# Patient Record
Sex: Female | Born: 1945 | Race: White | Hispanic: No | Marital: Married | Smoking: Never smoker
Health system: Southern US, Community
[De-identification: ages and names within clinical notes are randomized; demographics above are authoritative.]

## PROBLEM LIST (undated history)

## (undated) DIAGNOSIS — K219 Gastro-esophageal reflux disease without esophagitis: Secondary | ICD-10-CM

## (undated) DIAGNOSIS — R51 Headache: Secondary | ICD-10-CM

## (undated) DIAGNOSIS — I10 Essential (primary) hypertension: Secondary | ICD-10-CM

## (undated) DIAGNOSIS — R519 Headache, unspecified: Secondary | ICD-10-CM

## (undated) DIAGNOSIS — I00 Rheumatic fever without heart involvement: Secondary | ICD-10-CM

## (undated) HISTORY — PX: CHOLECYSTECTOMY: SHX55

## (undated) HISTORY — PX: APPENDECTOMY: SHX54

## (undated) HISTORY — PX: DILATION AND CURETTAGE OF UTERUS: SHX78

## (undated) HISTORY — PX: COLONOSCOPY: SHX174

## (undated) HISTORY — PX: TONSILLECTOMY: SUR1361

---

## 2004-05-30 ENCOUNTER — Ambulatory Visit: Payer: Self-pay | Admitting: Internal Medicine

## 2005-02-28 ENCOUNTER — Ambulatory Visit: Payer: Self-pay | Admitting: Gastroenterology

## 2005-08-21 ENCOUNTER — Ambulatory Visit: Payer: Self-pay | Admitting: Internal Medicine

## 2006-06-01 ENCOUNTER — Ambulatory Visit: Payer: Self-pay | Admitting: Internal Medicine

## 2007-09-14 ENCOUNTER — Ambulatory Visit: Payer: Self-pay | Admitting: Unknown Physician Specialty

## 2008-07-05 ENCOUNTER — Ambulatory Visit: Payer: Self-pay | Admitting: Internal Medicine

## 2010-05-17 ENCOUNTER — Ambulatory Visit: Payer: Self-pay | Admitting: Unknown Physician Specialty

## 2010-06-26 ENCOUNTER — Ambulatory Visit: Payer: Self-pay | Admitting: Internal Medicine

## 2016-06-19 DIAGNOSIS — Z1211 Encounter for screening for malignant neoplasm of colon: Secondary | ICD-10-CM | POA: Diagnosis not present

## 2016-06-19 DIAGNOSIS — Z23 Encounter for immunization: Secondary | ICD-10-CM | POA: Diagnosis not present

## 2016-06-19 DIAGNOSIS — I10 Essential (primary) hypertension: Secondary | ICD-10-CM | POA: Diagnosis not present

## 2016-06-19 DIAGNOSIS — E785 Hyperlipidemia, unspecified: Secondary | ICD-10-CM | POA: Diagnosis not present

## 2016-07-29 DIAGNOSIS — I10 Essential (primary) hypertension: Secondary | ICD-10-CM | POA: Diagnosis not present

## 2016-07-29 DIAGNOSIS — Z800 Family history of malignant neoplasm of digestive organs: Secondary | ICD-10-CM | POA: Diagnosis not present

## 2016-07-29 DIAGNOSIS — Z8371 Family history of colonic polyps: Secondary | ICD-10-CM | POA: Diagnosis not present

## 2016-07-29 DIAGNOSIS — Z1211 Encounter for screening for malignant neoplasm of colon: Secondary | ICD-10-CM | POA: Diagnosis not present

## 2016-10-23 ENCOUNTER — Encounter: Payer: Self-pay | Admitting: *Deleted

## 2016-10-24 ENCOUNTER — Encounter: Payer: Self-pay | Admitting: *Deleted

## 2016-10-24 ENCOUNTER — Ambulatory Visit: Payer: PPO | Admitting: Anesthesiology

## 2016-10-24 ENCOUNTER — Ambulatory Visit
Admission: RE | Admit: 2016-10-24 | Discharge: 2016-10-24 | Disposition: A | Discharge status: Home / Self Care | Payer: PPO | Source: Ambulatory Visit | Attending: Unknown Physician Specialty | Admitting: Unknown Physician Specialty

## 2016-10-24 ENCOUNTER — Encounter
Admission: RE | Disposition: A | Discharge status: Home / Self Care | Payer: Self-pay | Source: Ambulatory Visit | Attending: Unknown Physician Specialty

## 2016-10-24 DIAGNOSIS — I10 Essential (primary) hypertension: Secondary | ICD-10-CM | POA: Insufficient documentation

## 2016-10-24 DIAGNOSIS — K648 Other hemorrhoids: Secondary | ICD-10-CM | POA: Diagnosis not present

## 2016-10-24 DIAGNOSIS — Z1211 Encounter for screening for malignant neoplasm of colon: Secondary | ICD-10-CM | POA: Insufficient documentation

## 2016-10-24 DIAGNOSIS — K219 Gastro-esophageal reflux disease without esophagitis: Secondary | ICD-10-CM | POA: Insufficient documentation

## 2016-10-24 DIAGNOSIS — K5730 Diverticulosis of large intestine without perforation or abscess without bleeding: Secondary | ICD-10-CM | POA: Insufficient documentation

## 2016-10-24 DIAGNOSIS — Z79899 Other long term (current) drug therapy: Secondary | ICD-10-CM | POA: Diagnosis not present

## 2016-10-24 DIAGNOSIS — Z7982 Long term (current) use of aspirin: Secondary | ICD-10-CM | POA: Insufficient documentation

## 2016-10-24 DIAGNOSIS — K5790 Diverticulosis of intestine, part unspecified, without perforation or abscess without bleeding: Secondary | ICD-10-CM | POA: Diagnosis not present

## 2016-10-24 DIAGNOSIS — K640 First degree hemorrhoids: Secondary | ICD-10-CM | POA: Diagnosis not present

## 2016-10-24 DIAGNOSIS — Z800 Family history of malignant neoplasm of digestive organs: Secondary | ICD-10-CM | POA: Diagnosis not present

## 2016-10-24 HISTORY — DX: Headache, unspecified: R51.9

## 2016-10-24 HISTORY — DX: Rheumatic fever without heart involvement: I00

## 2016-10-24 HISTORY — DX: Headache: R51

## 2016-10-24 HISTORY — PX: COLONOSCOPY WITH PROPOFOL: SHX5780

## 2016-10-24 HISTORY — DX: Gastro-esophageal reflux disease without esophagitis: K21.9

## 2016-10-24 HISTORY — DX: Essential (primary) hypertension: I10

## 2016-10-24 SURGERY — COLONOSCOPY WITH PROPOFOL
Anesthesia: General

## 2016-10-24 MED ORDER — PROPOFOL 500 MG/50ML IV EMUL
INTRAVENOUS | Status: AC
  Filled 2016-10-24: qty 50

## 2016-10-24 MED ORDER — PROPOFOL 10 MG/ML IV BOLUS
INTRAVENOUS | Status: DC | PRN
  Administered 2016-10-24: 90 mg via INTRAVENOUS

## 2016-10-24 MED ORDER — FENTANYL CITRATE (PF) 100 MCG/2ML IJ SOLN: 25.0000 ug | INTRAMUSCULAR | Status: DC | PRN

## 2016-10-24 MED ORDER — ONDANSETRON HCL 4 MG/2ML IJ SOLN: 4.0000 mg | Freq: Once | INTRAMUSCULAR | Status: DC | PRN

## 2016-10-24 MED ORDER — SODIUM CHLORIDE 0.9 % IV SOLN: INTRAVENOUS | Status: DC

## 2016-10-24 MED ORDER — PROPOFOL 500 MG/50ML IV EMUL
INTRAVENOUS | Status: DC | PRN
  Administered 2016-10-24: 100 ug/kg/min via INTRAVENOUS

## 2016-10-24 MED ORDER — SODIUM CHLORIDE 0.9 % IV SOLN
INTRAVENOUS | Status: DC
  Administered 2016-10-24: 10:00:00 via INTRAVENOUS

## 2016-10-24 NOTE — H&P (Signed)
   Primary Care Physician:  Jerl MinaHedrick, James, MD Primary Gastroenterologist:  Dr. Mechele CollinElliott  Pre-Procedure History & Physical: HPI:  Daisy PatienceCarol A Placeres is a 71 y.o. female is here for an colonoscopy.   Past Medical History:  Diagnosis Date  . GERD (gastroesophageal reflux disease)   . Headache   . Hypertension   . Rheumatic fever    as a child    Past Surgical History:  Procedure Laterality Date  . APPENDECTOMY    . CHOLECYSTECTOMY    . COLONOSCOPY    . DILATION AND CURETTAGE OF UTERUS    . TONSILLECTOMY      Prior to Admission medications   Medication Sig Start Date End Date Taking? Authorizing Provider  aspirin EC 81 MG tablet Take 81 mg by mouth daily.   Yes [provider]  cholecalciferol (VITAMIN D) 400 units TABS tablet Take 400 Units by mouth.   Yes [provider]  hydrochlorothiazide (HYDRODIURIL) 25 MG tablet Take 25 mg by mouth daily.   Yes [provider]  losartan (COZAAR) 50 MG tablet Take 50 mg by mouth daily.   Yes [provider]  omega-3 acid ethyl esters (LOVAZA) 1 g capsule Take by mouth 2 (two) times daily.   Yes [provider]  calcium carbonate (TUMS - DOSED IN MG ELEMENTAL CALCIUM) 500 MG chewable tablet Chew 1 tablet by mouth daily.    [provider]    Allergies as of 08/12/2016  . (Not on File)    History reviewed. No pertinent family history.  Social History   Social History  . Marital status: Married    Spouse name: N/A  . Number of children: N/A  . Years of education: N/A   Occupational History  . Not on file.   Social History Main Topics  . Smoking status: Never Smoker  . Smokeless tobacco: Never Used  . Alcohol use No  . Drug use: No  . Sexual activity: Not on file   Other Topics Concern  . Not on file   Social History Narrative  . No narrative on file    Review of Systems: See HPI, otherwise negative ROS  Physical Exam: BP (!) 155/92   Pulse 68   Temp 97.1 F  (36.2 C) (Tympanic)   Resp 16   Ht 4\' 10"  (1.473 m)   Wt 53.5 kg (118 lb)   SpO2 100%   BMI 24.66 kg/m  General:   Alert,  pleasant and cooperative in NAD Head:  Normocephalic and atraumatic. Neck:  Supple; no masses or thyromegaly. Lungs:  Clear throughout to auscultation.    Heart:  Regular rate and rhythm. Abdomen:  Soft, nontender and nondistended. Normal bowel sounds, without guarding, and without rebound.   Neurologic:  Alert and  oriented x4;  grossly normal neurologically.  Impression/Plan: Daisy Patiencearol A Mogel is here for an colonoscopy to be performed for family history of colon cancer in her mother  Risks, benefits, limitations, and alternatives regarding  colonoscopy have been reviewed with the patient.  Questions have been answered.  All parties agreeable.   Lynnae PrudeELLIOTT, ROBERT, MD  10/24/2016, 11:00 AM

## 2016-10-24 NOTE — Anesthesia Post-op Follow-up Note (Cosign Needed)
Anesthesia QCDR form completed.        

## 2016-10-24 NOTE — Op Note (Signed)
[facility] Patient Name: Daisy KernCarol Aguilar Procedure Date: 10/24/2016 11:02 AM MRN: [id number] Account #: [id number] Date of Birth: 05/29/1946 Admit Type: Outpatient Age: [age] [facility] Gender: Female Note Status: Finalized Procedure:            Colonoscopy Indications:          Screening in patient at increased risk: Family history                        of 1st-degree relative with colorectal cancer Providers:            Scot Junobert T. Feather Berrie, MD Referring MD:         Rhona LeavensJames F. Burnett ShengHedrick, MD (Referring MD) Medicines:            Propofol per Anesthesia Complications:        No immediate complications. Procedure:            Pre-Anesthesia Assessment:                       - After reviewing the risks and benefits, the patient                        was deemed in satisfactory condition to undergo the                        procedure.                       After obtaining informed consent, the colonoscope was                        passed under direct vision. Throughout the procedure,                        the patient's blood pressure, pulse, and oxygen                        saturations were monitored continuously. The Olympus                        PCF-H180AL colonoscope ( S#: [id number] ) was introduced                        through the anus and advanced to the the cecum,                        identified by appendiceal orifice and ileocecal valve.                        The colonoscopy was performed without difficulty. The                        patient tolerated the procedure well. The quality of                        the bowel preparation was good. Findings:      Multiple small-mouthed diverticula were found in the sigmoid colon,       descending colon, transverse colon and ascending colon.      Internal hemorrhoids were found during endoscopy. The hemorrhoids were  Grade I (internal hemorrhoids that do not prolapse).      The exam was  otherwise without abnormality. Impression:           - Diverticulosis in the sigmoid colon, in the                        descending colon, in the transverse colon and in the                        ascending colon.                       - Internal hemorrhoids.                       - The examination was otherwise normal.                       - No specimens collected. Recommendation:       - Repeat colonoscopy in 5 years for screening purposes. Scot Jun, MD 10/24/2016 11:29:06 AM This report has been signed electronically. Number of Addenda: 0 Note Initiated On: 10/24/2016 11:02 AM Scope Withdrawal Time: 0 hours 8 minutes 50 seconds  Total Procedure Duration: 0 hours 17 minutes 12 seconds       [facility]

## 2016-10-24 NOTE — Transfer of Care (Signed)
Immediate Anesthesia Transfer of Care Note  Patient: Daisy Aguilar  Procedure(s) Performed: Procedure(s): COLONOSCOPY WITH PROPOFOL (N/A)  Patient Location: PACU and Endoscopy Unit  Anesthesia Type:General  Level of Consciousness: drowsy and patient cooperative  Airway & Oxygen Therapy: Patient Spontanous Breathing and Patient connected to nasal cannula oxygen  Post-op Assessment: Report given to RN and Post -op Vital signs reviewed and stable  Post vital signs: Reviewed and stable  Last Vitals:  Vitals:   10/24/16 1005 10/24/16 1132  BP: (!) 155/92 (!) 102/56  Pulse: 68 70  Resp: 16 16  Temp: 36.2 C (!) 36.1 C    Last Pain:  Vitals:   10/24/16 1132  TempSrc: Tympanic         Complications: No apparent anesthesia complications

## 2016-10-24 NOTE — Anesthesia Postprocedure Evaluation (Signed)
Anesthesia Post Note  Patient: Daisy Aguilar  Procedure(s) Performed: Procedure(s) (LRB): COLONOSCOPY WITH PROPOFOL (N/A)  Patient location during evaluation: PACU Anesthesia Type: General Level of consciousness: awake and alert and oriented Pain management: pain level controlled Vital Signs Assessment: post-procedure vital signs reviewed and stable Respiratory status: spontaneous breathing Cardiovascular status: blood pressure returned to baseline Anesthetic complications: no     Last Vitals:  Vitals:   10/24/16 1152 10/24/16 1201  BP: (!) 175/67 (!) 150/76  Pulse: (!) 55 (!) 50  Resp: 13 (!) 9  Temp:      Last Pain:  Vitals:   10/24/16 1132  TempSrc: Tympanic                 Sueann Brownley

## 2016-10-24 NOTE — Anesthesia Preprocedure Evaluation (Signed)
Anesthesia Evaluation  Patient identified by MRN, date of birth, ID band Patient awake    Reviewed: Allergy & Precautions, NPO status , Patient's Chart, lab work & pertinent test results  Airway Mallampati: III  TM Distance: <3 FB     Dental   Pulmonary neg pulmonary ROS,    Pulmonary exam normal        Cardiovascular hypertension, Pt. on medications Normal cardiovascular exam     Neuro/Psych  Headaches, negative psych ROS   GI/Hepatic Neg liver ROS, GERD  ,  Endo/Other  negative endocrine ROS  Renal/GU negative Renal ROS  negative genitourinary   Musculoskeletal negative musculoskeletal ROS (+)   Abdominal Normal abdominal exam  (+)   Peds negative pediatric ROS (+)  Hematology negative hematology ROS (+)   Anesthesia Other Findings   Reproductive/Obstetrics                            Anesthesia Physical Anesthesia Plan  ASA: II  Anesthesia Plan: General   Post-op Pain Management:    Induction: Intravenous  Airway Management Planned: Nasal Cannula  Additional Equipment:   Intra-op Plan:   Post-operative Plan:   Informed Consent: I have reviewed the patients History and Physical, chart, labs and discussed the procedure including the risks, benefits and alternatives for the proposed anesthesia with the patient or authorized representative who has indicated his/her understanding and acceptance.   Dental advisory given  Plan Discussed with: CRNA and Surgeon  Anesthesia Plan Comments:         Anesthesia Quick Evaluation

## 2016-10-30 ENCOUNTER — Encounter: Payer: Self-pay | Admitting: Unknown Physician Specialty

## 2016-12-15 DIAGNOSIS — K5792 Diverticulitis of intestine, part unspecified, without perforation or abscess without bleeding: Secondary | ICD-10-CM | POA: Diagnosis not present

## 2016-12-15 DIAGNOSIS — R110 Nausea: Secondary | ICD-10-CM | POA: Diagnosis not present

## 2017-03-13 DIAGNOSIS — Z23 Encounter for immunization: Secondary | ICD-10-CM | POA: Diagnosis not present

## 2017-05-07 DIAGNOSIS — Z0000 Encounter for general adult medical examination without abnormal findings: Secondary | ICD-10-CM | POA: Diagnosis not present

## 2017-05-14 DIAGNOSIS — Z0000 Encounter for general adult medical examination without abnormal findings: Secondary | ICD-10-CM | POA: Diagnosis not present

## 2017-05-14 DIAGNOSIS — I10 Essential (primary) hypertension: Secondary | ICD-10-CM | POA: Diagnosis not present

## 2017-05-14 DIAGNOSIS — E785 Hyperlipidemia, unspecified: Secondary | ICD-10-CM | POA: Diagnosis not present

## 2017-05-14 DIAGNOSIS — Z23 Encounter for immunization: Secondary | ICD-10-CM | POA: Diagnosis not present

## 2017-05-14 DIAGNOSIS — H6983 Other specified disorders of Eustachian tube, bilateral: Secondary | ICD-10-CM | POA: Diagnosis not present

## 2017-05-15 ENCOUNTER — Other Ambulatory Visit: Payer: Self-pay | Admitting: Family Medicine

## 2017-05-15 DIAGNOSIS — Z1231 Encounter for screening mammogram for malignant neoplasm of breast: Secondary | ICD-10-CM

## 2017-06-24 ENCOUNTER — Ambulatory Visit
Admission: RE | Admit: 2017-06-24 | Discharge: 2017-06-24 | Disposition: A | Discharge status: Home / Self Care | Payer: PPO | Source: Ambulatory Visit | Attending: Family Medicine | Admitting: Family Medicine

## 2017-06-24 DIAGNOSIS — Z1231 Encounter for screening mammogram for malignant neoplasm of breast: Secondary | ICD-10-CM | POA: Insufficient documentation

## 2017-07-08 ENCOUNTER — Inpatient Hospital Stay
Admission: RE | Admit: 2017-07-08 | Discharge: 2017-07-08 | Disposition: A | Discharge status: Home / Self Care | Payer: Self-pay | Source: Ambulatory Visit | Attending: *Deleted | Admitting: *Deleted

## 2017-07-08 ENCOUNTER — Other Ambulatory Visit: Payer: Self-pay | Admitting: *Deleted

## 2017-07-08 DIAGNOSIS — Z9289 Personal history of other medical treatment: Secondary | ICD-10-CM

## 2017-10-19 DIAGNOSIS — H6980 Other specified disorders of Eustachian tube, unspecified ear: Secondary | ICD-10-CM | POA: Diagnosis not present

## 2017-10-19 DIAGNOSIS — H903 Sensorineural hearing loss, bilateral: Secondary | ICD-10-CM | POA: Diagnosis not present

## 2017-10-19 DIAGNOSIS — J358 Other chronic diseases of tonsils and adenoids: Secondary | ICD-10-CM | POA: Diagnosis not present

## 2017-10-19 DIAGNOSIS — R1314 Dysphagia, pharyngoesophageal phase: Secondary | ICD-10-CM | POA: Diagnosis not present

## 2017-12-23 DIAGNOSIS — Z0184 Encounter for antibody response examination: Secondary | ICD-10-CM | POA: Diagnosis not present

## 2018-04-24 DIAGNOSIS — M1712 Unilateral primary osteoarthritis, left knee: Secondary | ICD-10-CM | POA: Diagnosis not present

## 2018-04-24 DIAGNOSIS — M25562 Pain in left knee: Secondary | ICD-10-CM | POA: Diagnosis not present

## 2018-07-12 DIAGNOSIS — M1712 Unilateral primary osteoarthritis, left knee: Secondary | ICD-10-CM | POA: Diagnosis not present

## 2018-07-13 DIAGNOSIS — Z111 Encounter for screening for respiratory tuberculosis: Secondary | ICD-10-CM | POA: Diagnosis not present

## 2018-07-15 DIAGNOSIS — M1712 Unilateral primary osteoarthritis, left knee: Secondary | ICD-10-CM | POA: Diagnosis not present

## 2018-11-30 DIAGNOSIS — E785 Hyperlipidemia, unspecified: Secondary | ICD-10-CM | POA: Diagnosis not present

## 2018-11-30 DIAGNOSIS — I10 Essential (primary) hypertension: Secondary | ICD-10-CM | POA: Diagnosis not present

## 2018-12-07 DIAGNOSIS — I10 Essential (primary) hypertension: Secondary | ICD-10-CM | POA: Diagnosis not present

## 2018-12-07 DIAGNOSIS — E785 Hyperlipidemia, unspecified: Secondary | ICD-10-CM | POA: Diagnosis not present

## 2018-12-07 DIAGNOSIS — Z23 Encounter for immunization: Secondary | ICD-10-CM | POA: Diagnosis not present

## 2018-12-07 DIAGNOSIS — Z0000 Encounter for general adult medical examination without abnormal findings: Secondary | ICD-10-CM | POA: Diagnosis not present

## 2018-12-13 ENCOUNTER — Other Ambulatory Visit: Payer: Self-pay | Admitting: Family Medicine

## 2018-12-13 DIAGNOSIS — Z1231 Encounter for screening mammogram for malignant neoplasm of breast: Secondary | ICD-10-CM

## 2019-01-18 ENCOUNTER — Ambulatory Visit
Admission: RE | Admit: 2019-01-18 | Discharge: 2019-01-18 | Disposition: A | Discharge status: Home / Self Care | Payer: PPO | Source: Ambulatory Visit | Attending: Family Medicine | Admitting: Family Medicine

## 2019-01-18 ENCOUNTER — Other Ambulatory Visit: Payer: Self-pay

## 2019-01-18 DIAGNOSIS — Z1231 Encounter for screening mammogram for malignant neoplasm of breast: Secondary | ICD-10-CM | POA: Insufficient documentation

## 2019-04-06 DIAGNOSIS — M1712 Unilateral primary osteoarthritis, left knee: Secondary | ICD-10-CM | POA: Diagnosis not present

## 2019-05-19 DIAGNOSIS — M1711 Unilateral primary osteoarthritis, right knee: Secondary | ICD-10-CM | POA: Diagnosis not present

## 2019-06-05 DIAGNOSIS — Z20828 Contact with and (suspected) exposure to other viral communicable diseases: Secondary | ICD-10-CM | POA: Diagnosis not present

## 2019-07-22 DIAGNOSIS — M1712 Unilateral primary osteoarthritis, left knee: Secondary | ICD-10-CM | POA: Diagnosis not present

## 2019-10-20 DIAGNOSIS — M170 Bilateral primary osteoarthritis of knee: Secondary | ICD-10-CM | POA: Diagnosis not present

## 2019-12-02 DIAGNOSIS — E785 Hyperlipidemia, unspecified: Secondary | ICD-10-CM | POA: Diagnosis not present

## 2019-12-02 DIAGNOSIS — I10 Essential (primary) hypertension: Secondary | ICD-10-CM | POA: Diagnosis not present

## 2019-12-08 DIAGNOSIS — Z23 Encounter for immunization: Secondary | ICD-10-CM | POA: Diagnosis not present

## 2019-12-08 DIAGNOSIS — R319 Hematuria, unspecified: Secondary | ICD-10-CM | POA: Diagnosis not present

## 2019-12-08 DIAGNOSIS — Z0000 Encounter for general adult medical examination without abnormal findings: Secondary | ICD-10-CM | POA: Diagnosis not present

## 2019-12-08 DIAGNOSIS — E785 Hyperlipidemia, unspecified: Secondary | ICD-10-CM | POA: Diagnosis not present

## 2019-12-08 DIAGNOSIS — I10 Essential (primary) hypertension: Secondary | ICD-10-CM | POA: Diagnosis not present

## 2020-01-26 DIAGNOSIS — M170 Bilateral primary osteoarthritis of knee: Secondary | ICD-10-CM | POA: Diagnosis not present

## 2020-04-01 IMAGING — MG DIGITAL SCREENING BILATERAL MAMMOGRAM WITH TOMO AND CAD
8 series · 9 of 24 positions shown · non-contrast
Comparison: Previous exam(s).

CLINICAL DATA: Screening.

EXAM:
DIGITAL SCREENING BILATERAL MAMMOGRAM WITH TOMO AND CAD

[R CC synth-2D]
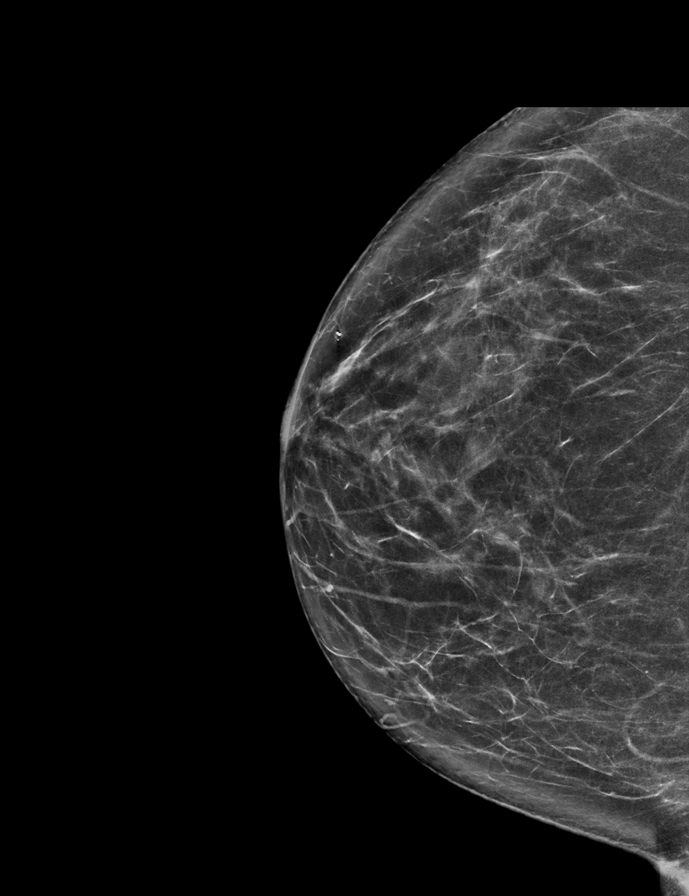

[L CC synth-2D]
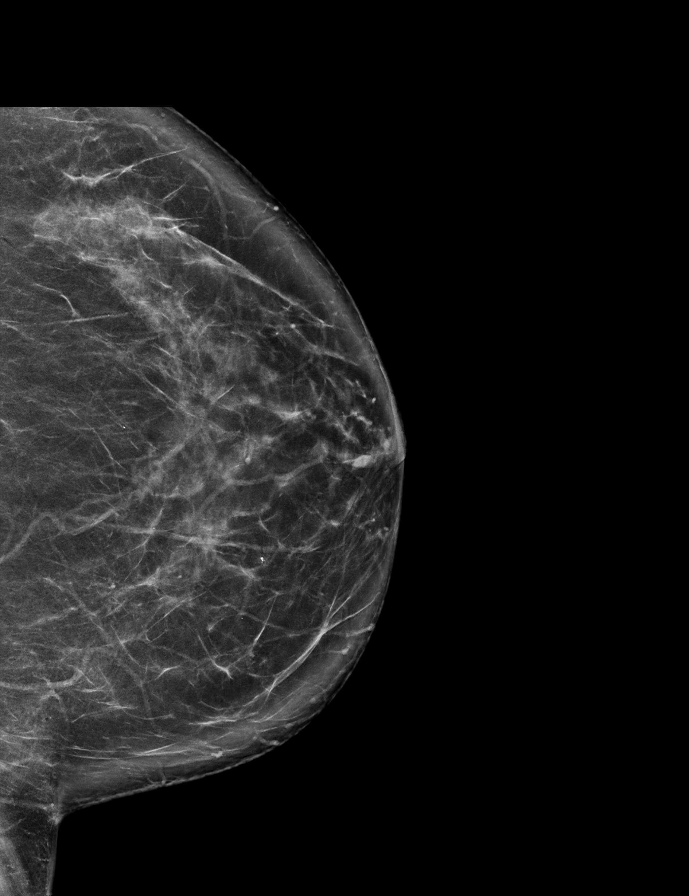

[R MLO synth-2D]
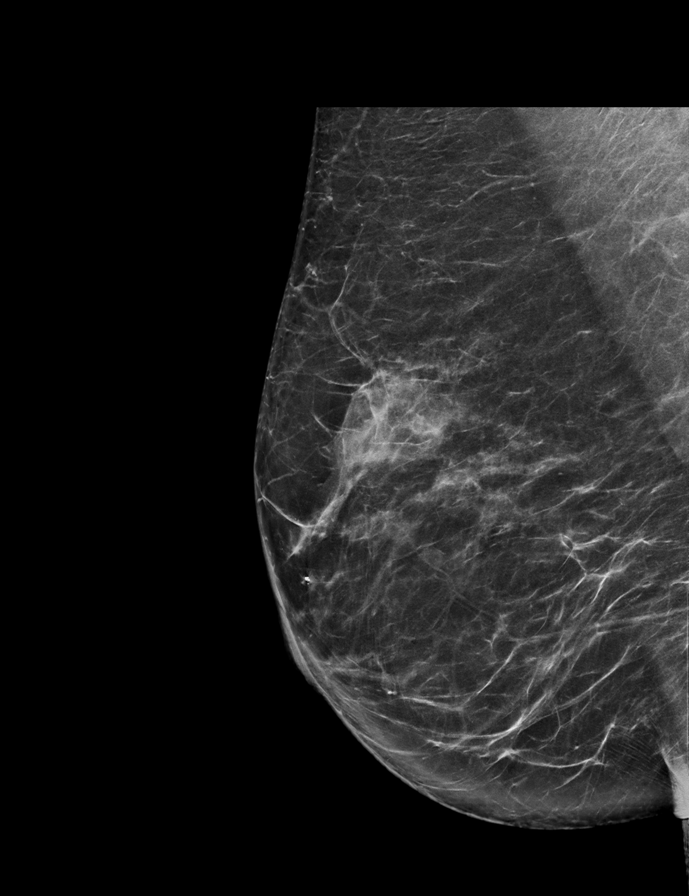

[L MLO synth-2D]
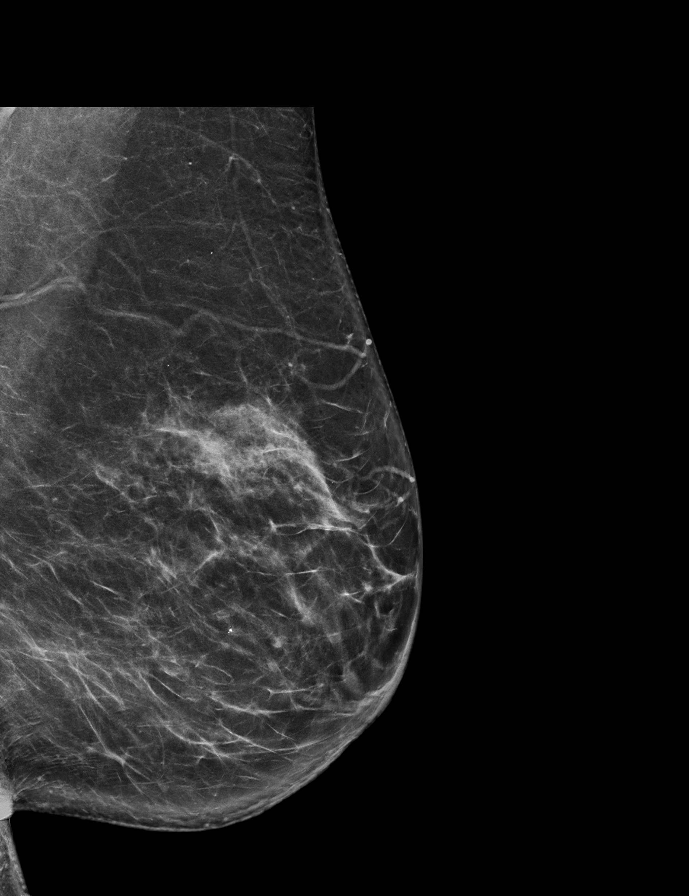

[R MLO tomo · 2 of 79 frames shown]
[frame 26/79]
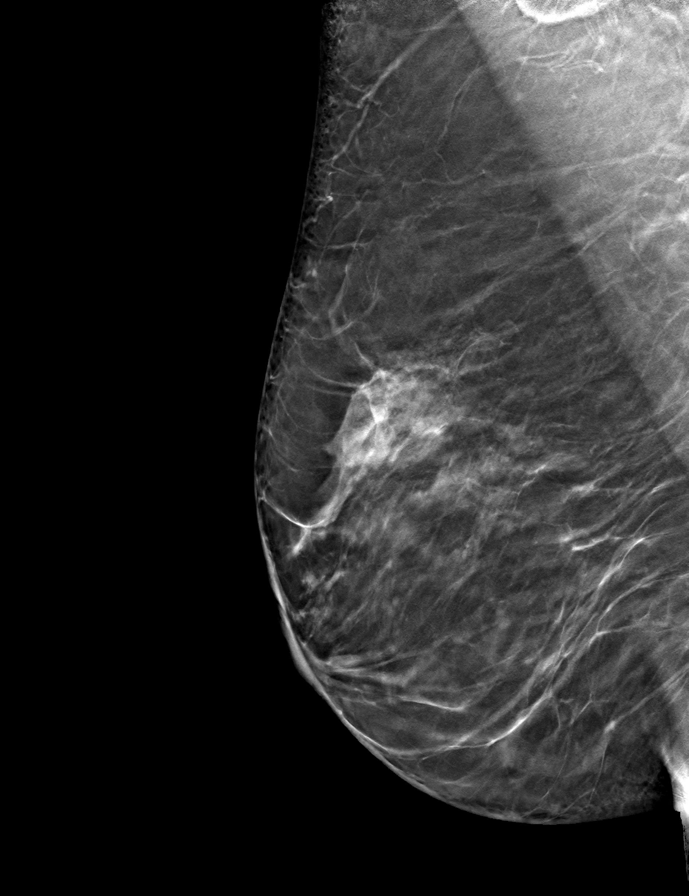
[frame 40/79]
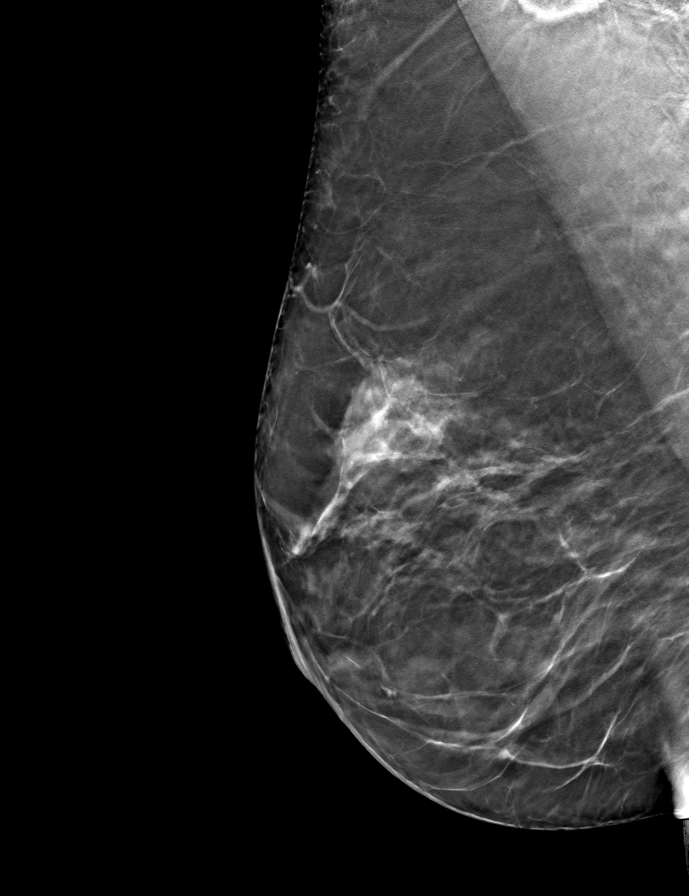

[R CC tomo · tomo slice 35/68.0]
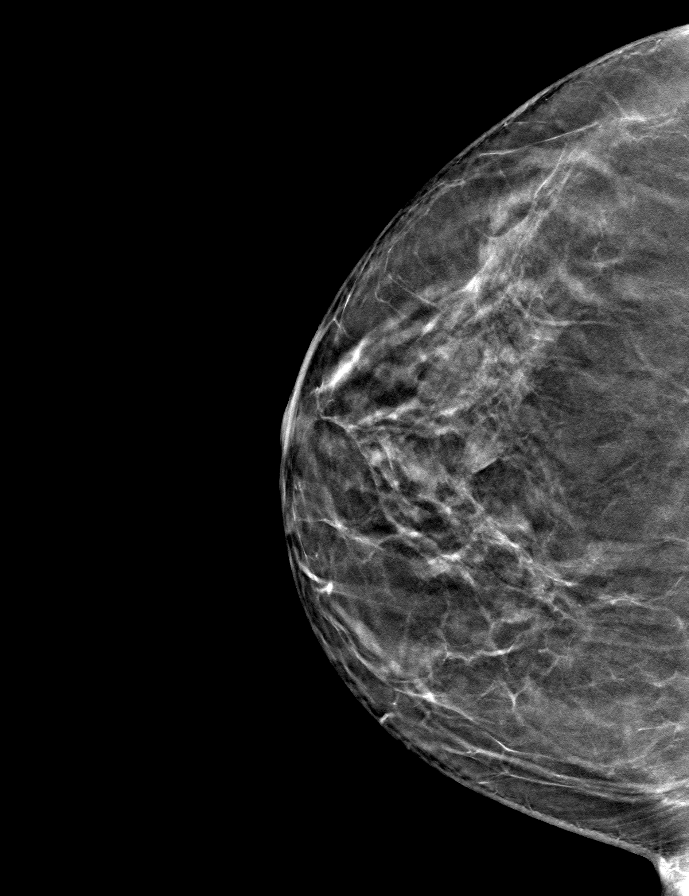

[L CC tomo · tomo slice 40/79.0]
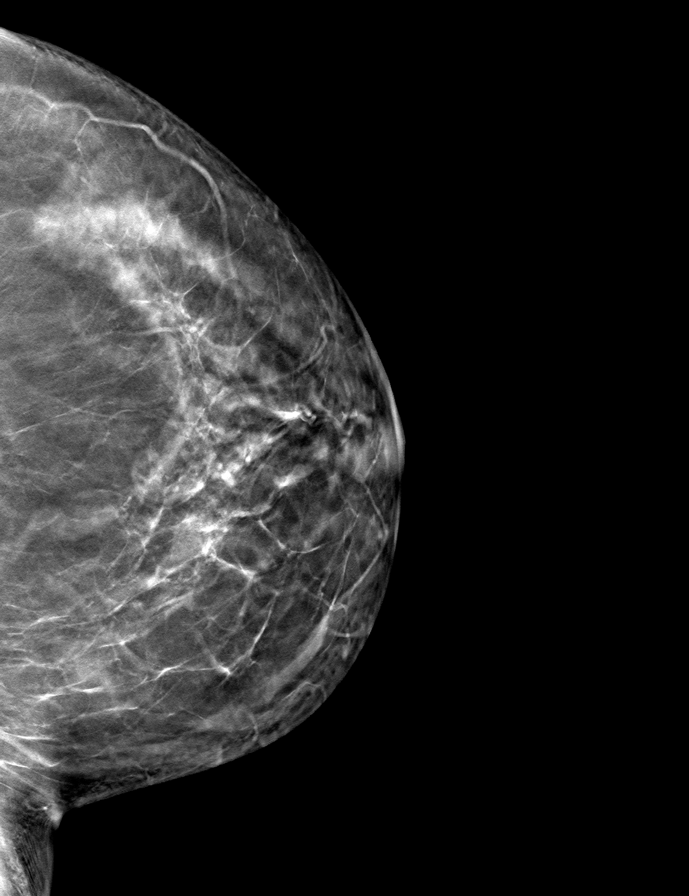

[L MLO tomo · tomo slice 40/79.0]
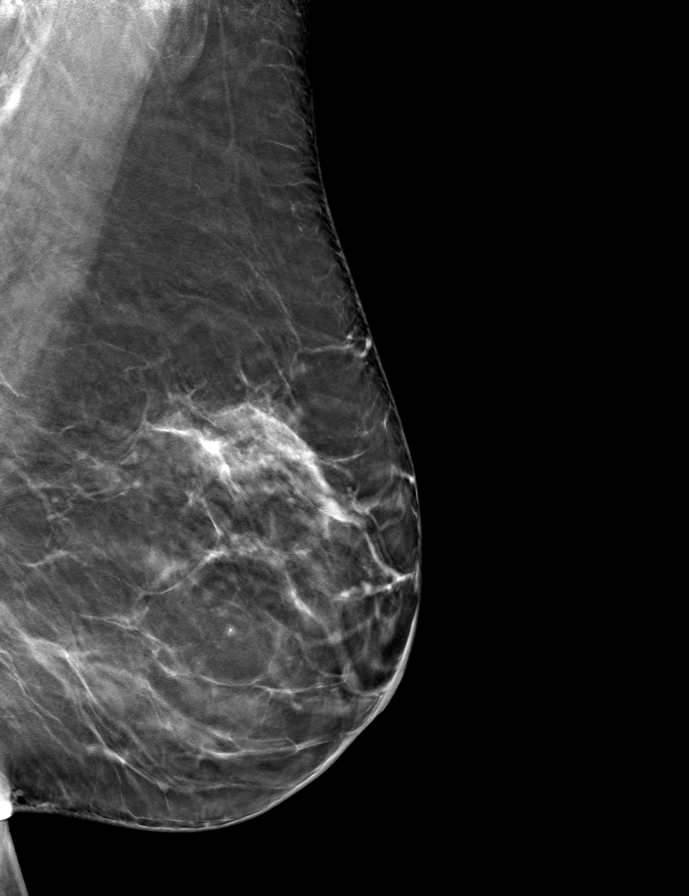

[9 of 24 positions shown; findings below may reference images not displayed]

ACR Breast Density Category c: The breast tissue is heterogeneously
dense, which may obscure small masses.
FINDINGS: There are no findings suspicious for malignancy. Images were
processed with CAD.
IMPRESSION: No mammographic evidence of malignancy. A result letter of this
screening mammogram will be mailed directly to the patient.

RECOMMENDATION:
Screening mammogram in one year. (Code:FT-U-LHB)

BI-RADS CATEGORY  1: Negative.
